# Patient Record
Sex: Female | Born: 2000 | Race: White | Hispanic: No | Marital: Single | Smoking: Never smoker
Health system: Southern US, Community
[De-identification: ages and names within clinical notes are randomized; demographics above are authoritative.]

---
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
## 2021-09-27 ENCOUNTER — Ambulatory Visit: Payer: Self-pay | Admitting: Internal Medicine
